# Patient Record
Sex: Male | Born: 1980 | Race: Black or African American | Hispanic: No | Marital: Single | Smoking: Current some day smoker
Health system: Southern US, Community
[De-identification: ages and names within clinical notes are randomized; demographics above are authoritative.]

## PROBLEM LIST (undated history)

## (undated) DIAGNOSIS — H9190 Unspecified hearing loss, unspecified ear: Secondary | ICD-10-CM

## (undated) DIAGNOSIS — I10 Essential (primary) hypertension: Secondary | ICD-10-CM

---

## 2017-04-28 ENCOUNTER — Other Ambulatory Visit: Payer: Self-pay

## 2017-04-28 ENCOUNTER — Encounter (HOSPITAL_COMMUNITY): Payer: Self-pay | Admitting: Emergency Medicine

## 2017-04-28 ENCOUNTER — Emergency Department (HOSPITAL_COMMUNITY): Payer: Self-pay

## 2017-04-28 ENCOUNTER — Emergency Department (HOSPITAL_COMMUNITY)
Admission: EM | Admit: 2017-04-28 | Discharge: 2017-04-28 | Disposition: A | Discharge status: Home / Self Care | Payer: Self-pay | Attending: Emergency Medicine | Admitting: Emergency Medicine

## 2017-04-28 DIAGNOSIS — R0789 Other chest pain: Secondary | ICD-10-CM | POA: Insufficient documentation

## 2017-04-28 DIAGNOSIS — F17200 Nicotine dependence, unspecified, uncomplicated: Secondary | ICD-10-CM | POA: Insufficient documentation

## 2017-04-28 DIAGNOSIS — Z79899 Other long term (current) drug therapy: Secondary | ICD-10-CM | POA: Insufficient documentation

## 2017-04-28 DIAGNOSIS — I10 Essential (primary) hypertension: Secondary | ICD-10-CM | POA: Insufficient documentation

## 2017-04-28 HISTORY — DX: Unspecified hearing loss, unspecified ear: H91.90

## 2017-04-28 HISTORY — DX: Essential (primary) hypertension: I10

## 2017-04-28 LAB — CBC WITH DIFFERENTIAL/PLATELET
Basophils Absolute: 0.1 10*3/uL (ref 0.0–0.1)
Basophils Relative: 1 %
EOS ABS: 0.2 10*3/uL (ref 0.0–0.7)
EOS PCT: 2 %
HCT: 42.9 % (ref 39.0–52.0)
Hemoglobin: 13.5 g/dL (ref 13.0–17.0)
Lymphocytes Relative: 30 %
Lymphs Abs: 3.2 10*3/uL (ref 0.7–4.0)
MCH: 27.3 pg (ref 26.0–34.0)
MCHC: 31.5 g/dL (ref 30.0–36.0)
MCV: 86.7 fL (ref 78.0–100.0)
MONO ABS: 0.6 10*3/uL (ref 0.1–1.0)
MONOS PCT: 5 %
Neutro Abs: 6.7 10*3/uL (ref 1.7–7.7)
Neutrophils Relative %: 62 %
PLATELETS: 394 10*3/uL (ref 150–400)
RBC: 4.95 MIL/uL (ref 4.22–5.81)
RDW: 14.3 % (ref 11.5–15.5)
WBC: 10.6 10*3/uL — AB (ref 4.0–10.5)

## 2017-04-28 LAB — COMPREHENSIVE METABOLIC PANEL
ALT: 21 U/L (ref 17–63)
ANION GAP: 10 (ref 5–15)
AST: 24 U/L (ref 15–41)
Albumin: 4.3 g/dL (ref 3.5–5.0)
Alkaline Phosphatase: 73 U/L (ref 38–126)
BUN: 12 mg/dL (ref 6–20)
CHLORIDE: 101 mmol/L (ref 101–111)
CO2: 29 mmol/L (ref 22–32)
Calcium: 9.2 mg/dL (ref 8.9–10.3)
Creatinine, Ser: 1.03 mg/dL (ref 0.61–1.24)
Glucose, Bld: 98 mg/dL (ref 65–99)
Potassium: 3.4 mmol/L — ABNORMAL LOW (ref 3.5–5.1)
SODIUM: 140 mmol/L (ref 135–145)
Total Bilirubin: 0.3 mg/dL (ref 0.3–1.2)
Total Protein: 8.7 g/dL — ABNORMAL HIGH (ref 6.5–8.1)

## 2017-04-28 LAB — TROPONIN I: Troponin I: <0.03 ng/mL (ref <0.03)

## 2017-04-28 MED ORDER — AMLODIPINE BESYLATE 5 MG PO TABS
ORAL_TABLET | ORAL | Status: AC
  Filled 2017-04-28: qty 1

## 2017-04-28 MED ORDER — IBUPROFEN 400 MG PO TABS
ORAL_TABLET | ORAL | Status: AC
  Administered 2017-04-28: 600 mg
  Filled 2017-04-28: qty 2

## 2017-04-28 MED ORDER — LABETALOL HCL 5 MG/ML IV SOLN
10.0000 mg | Freq: Once | INTRAVENOUS | Status: AC
  Administered 2017-04-28: 10 mg via INTRAVENOUS
  Filled 2017-04-28: qty 4

## 2017-04-28 MED ORDER — AMLODIPINE BESYLATE 5 MG PO TABS
5.0000 mg | ORAL_TABLET | Freq: Once | ORAL | Status: AC
  Administered 2017-04-28: 5 mg via ORAL
  Filled 2017-04-28: qty 1

## 2017-04-28 MED ORDER — HYDROCHLOROTHIAZIDE 25 MG PO TABS
25.0000 mg | ORAL_TABLET | Freq: Every day | ORAL | Status: DC
  Administered 2017-04-28: 25 mg via ORAL
  Filled 2017-04-28 (×2): qty 1

## 2017-04-28 NOTE — ED Provider Notes (Signed)
[facility] EMERGENCY DEPARTMENT Provider Note   CSN: [id number] Arrival date & time: 04/28/17  1608     History   Chief Complaint Chief Complaint  Patient presents with  . Hypertension    HPI Johnny Sweeney is a 37 y.o. male.  HPI Patient with history of hypertension but has not been on blood pressure medication for several years went to the [facility] today for routine physical.  Noted to have elevated blood pressure.  Referred to the emergency department.  Patient denies any headaches or visual changes.  He has minor ongoing lower chest discomfort.  No lower extremity swelling or pain.  Patient infrequently smokes cigarettes. Past Medical History:  Diagnosis Date  . Hearing loss   . Hypertension     There are no active problems to display for this patient.   History reviewed. No pertinent surgical history.     Home Medications    Prior to Admission medications   Medication Sig Start Date End Date Taking? Authorizing Provider  amLODipine (NORVASC) 10 MG tablet Take 10 mg by mouth daily.   Yes [provider]  hydrochlorothiazide (HYDRODIURIL) 25 MG tablet Take 25 mg by mouth daily.   Yes [provider]  Multiple Vitamins-Minerals (MULTIVITAMIN GUMMIES ADULT PO) Take by mouth daily.   Yes [provider]    Family History Family History  Problem Relation Age of Onset  . Diabetes Other   . Hypertension Other     Social History Social History   Tobacco Use  . Smoking status: Current Some Day Smoker  . Smokeless tobacco: Never Used  Substance Use Topics  . Alcohol use: Not on file    Comment: occas  . Drug use: No     Allergies   Patient has no known allergies.   Review of Systems Review of Systems  Constitutional: Negative for chills and fever.  Eyes: Negative for visual disturbance.  Respiratory: Negative for cough and shortness of breath.   Cardiovascular: Positive for chest pain. Negative for palpitations and leg swelling.    Gastrointestinal: Negative for abdominal pain, constipation, diarrhea, nausea and vomiting.  Musculoskeletal: Negative for back pain, neck pain and neck stiffness.  Skin: Negative for rash and wound.  Neurological: Negative for dizziness, weakness, light-headedness, numbness and headaches.  All other systems reviewed and are negative.    Physical Exam Updated Vital Signs BP (!) 196/119 (BP Location: Right Arm)   Pulse 74   Temp 98.1 F (36.7 C) (Oral)   Resp 16   Ht 6\' 2"  (1.88 m)   Wt (!) 172.4 kg (380 lb)   SpO2 100%   BMI 48.79 kg/m   Physical Exam  Constitutional: He is oriented to person, place, and time. He appears well-developed and well-nourished. No distress.  HENT:  Head: Normocephalic and atraumatic.  Mouth/Throat: Oropharynx is clear and moist. No oropharyngeal exudate.  Eyes: EOM are normal. Pupils are equal, round, and reactive to light.  Neck: Normal range of motion. Neck supple. No JVD present. No tracheal deviation present. No thyromegaly present.  Cardiovascular: Normal rate and regular rhythm. Exam reveals no gallop and no friction rub.  No murmur heard. Pulmonary/Chest: Effort normal and breath sounds normal. No stridor. No respiratory distress. He has no wheezes. He has no rales. He exhibits tenderness.  Reproduced mild inferior central chest discomfort with palpation.  No crepitance or deformity.  Abdominal: Soft. Bowel sounds are normal. There is no tenderness. There is no rebound and no guarding.  Musculoskeletal: Normal  range of motion. He exhibits no edema or tenderness.  No lower extremity swelling or asymmetry.  Distal pulses are 3+.  Lymphadenopathy:    He has no cervical adenopathy.  Neurological: He is alert and oriented to person, place, and time.  Moves all extremities without focal deficit.  Sensation intact.  Skin: Skin is warm and dry. Capillary refill takes less than 2 seconds. No rash noted. He is not diaphoretic. No erythema.   Psychiatric: He has a normal mood and affect. His behavior is normal.  Nursing note and vitals reviewed.    ED Treatments / Results  Labs (all labs ordered are listed, but only abnormal results are displayed) Labs Reviewed  CBC WITH DIFFERENTIAL/PLATELET - Abnormal; Notable for the following components:      Result Value   WBC 10.6 (*)    All other components within normal limits  COMPREHENSIVE METABOLIC PANEL - Abnormal; Notable for the following components:   Potassium 3.4 (*)    Total Protein 8.7 (*)    All other components within normal limits  TROPONIN I  TROPONIN I    EKG  EKG Interpretation None       Radiology Dg Chest 2 View  Result Date: 04/28/2017 CLINICAL DATA:  Mid chest pressure, cough with phlegm production a few weeks ago which has resolved, hypertension, social smoker EXAM: CHEST  2 VIEW COMPARISON:  None FINDINGS: Normal heart size, mediastinal contours, and pulmonary vascularity. Lungs clear. No pleural effusion or pneumothorax. Bones unremarkable. IMPRESSION: Normal exam. Electronically Signed   By: Ulyses Southward M.D.   On: 04/28/2017 17:56    Procedures Procedures (including critical care time)  Medications Ordered in ED Medications  hydrochlorothiazide (HYDRODIURIL) tablet 25 mg (25 mg Oral Given 04/28/17 2007)  labetalol (NORMODYNE,TRANDATE) injection 10 mg (10 mg Intravenous Given 04/28/17 1725)  amLODipine (NORVASC) tablet 5 mg (5 mg Oral Given 04/28/17 2006)     Initial Impression / Assessment and Plan / ED Course  I have reviewed the triage vital signs and the nursing notes.  Pertinent labs & imaging results that were available during my care of the patient were reviewed by me and considered in my medical decision making (see chart for details).    Blood pressure is improved.  Patient given a dose of his home meds in the emergency department.  Troponin x2 is normal.  Changes in the EKG likely due to to chronic hypertension.  [facility].  The chest pain is very atypical for CAD, have advised follow-up with cardiology.  Return precautions given.   Final Clinical Impressions(s) / ED Diagnoses   Final diagnoses:  Hypertension, unspecified type  Atypical chest pain    ED Discharge Orders    None       Loren Racer, MD 04/28/17 2105

## 2017-04-28 NOTE — ED Triage Notes (Signed)
[facility] after being checked for hypertension and having an EKG. No copies of EKG sent.

## 2018-10-31 IMAGING — DX DG CHEST 2V
2 series · 2 of 2 positions shown · non-contrast
Comparison: None

CLINICAL DATA: Mid chest pressure, cough with phlegm production a
few weeks ago which has resolved, hypertension, social smoker

EXAM:
CHEST  2 VIEW

[chest pa]
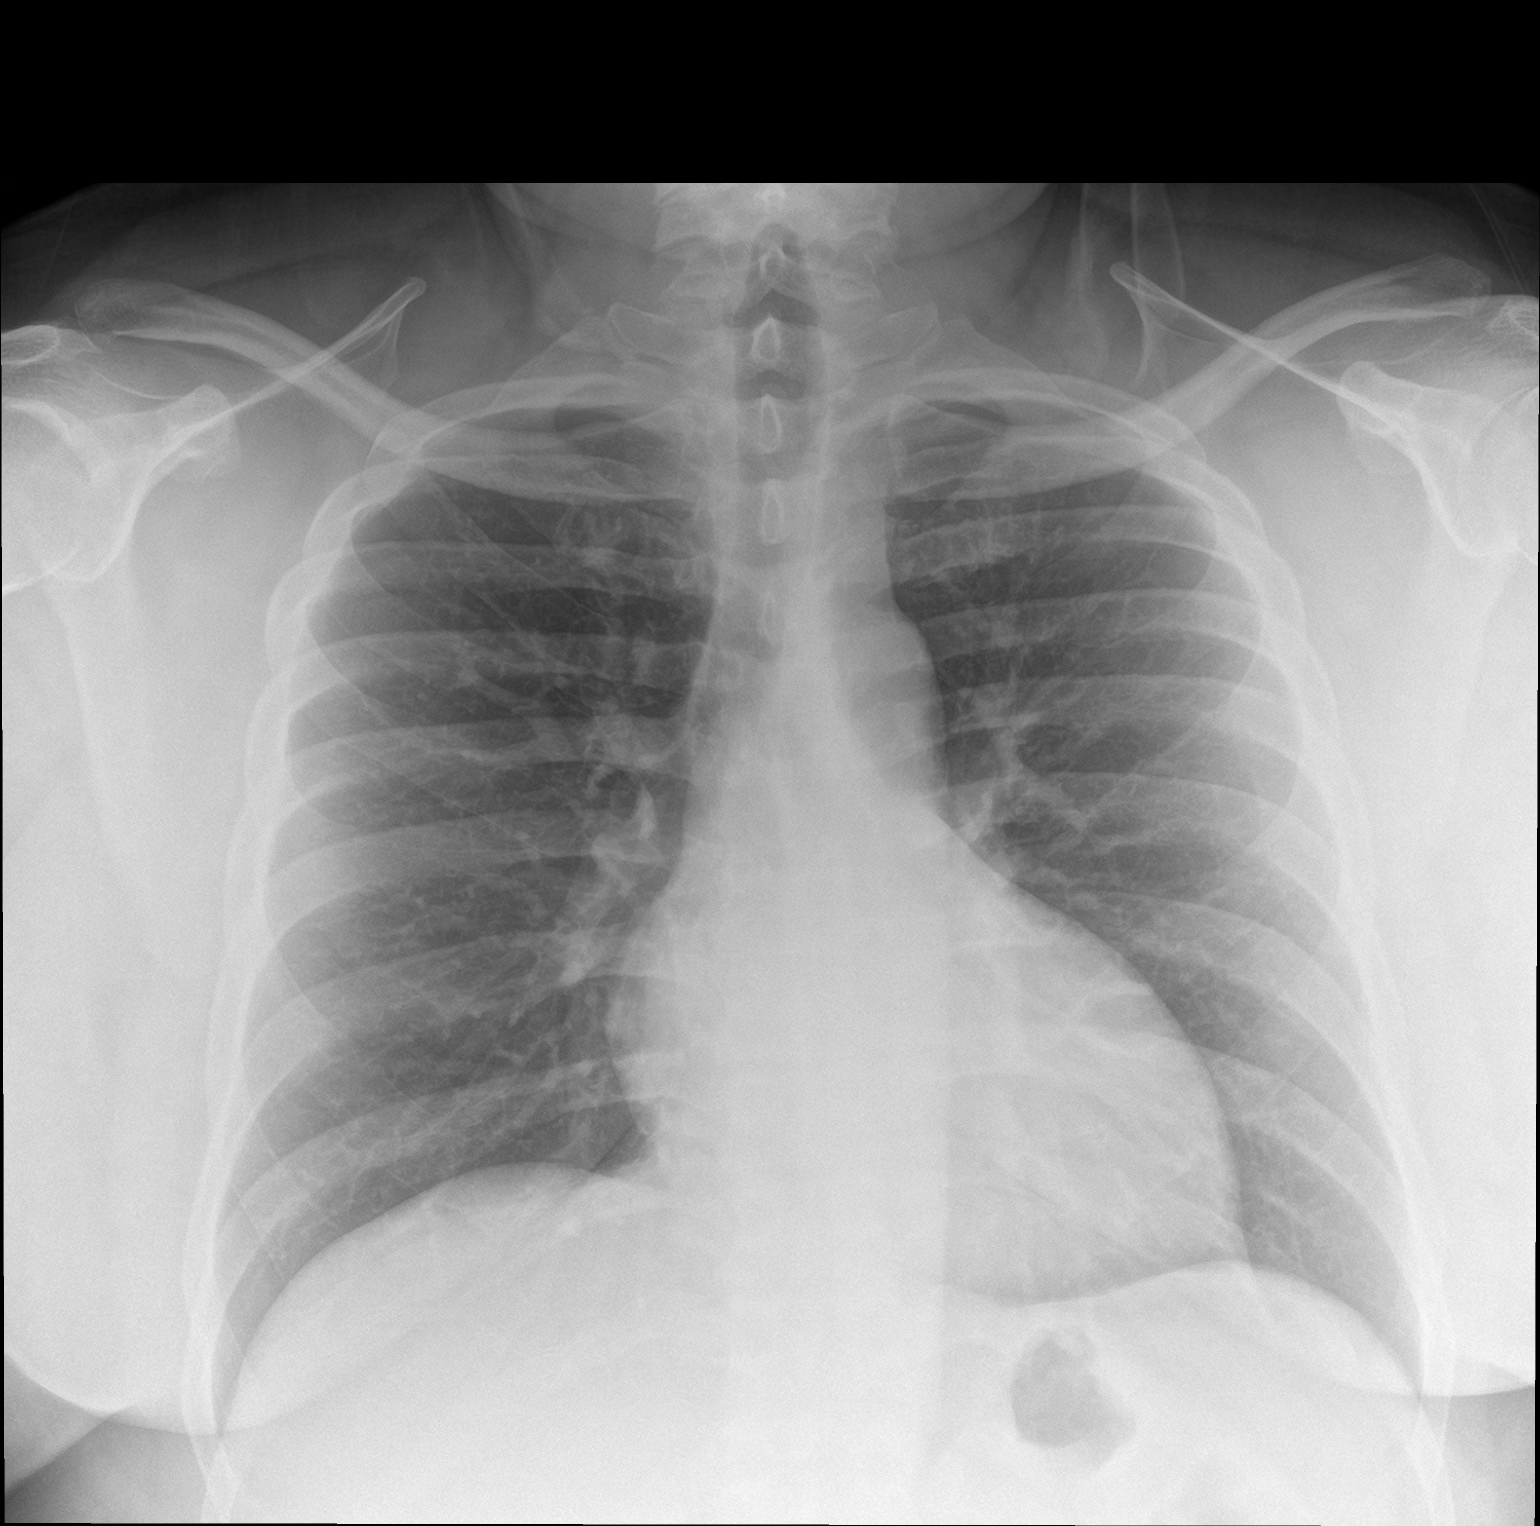

[chest lat]
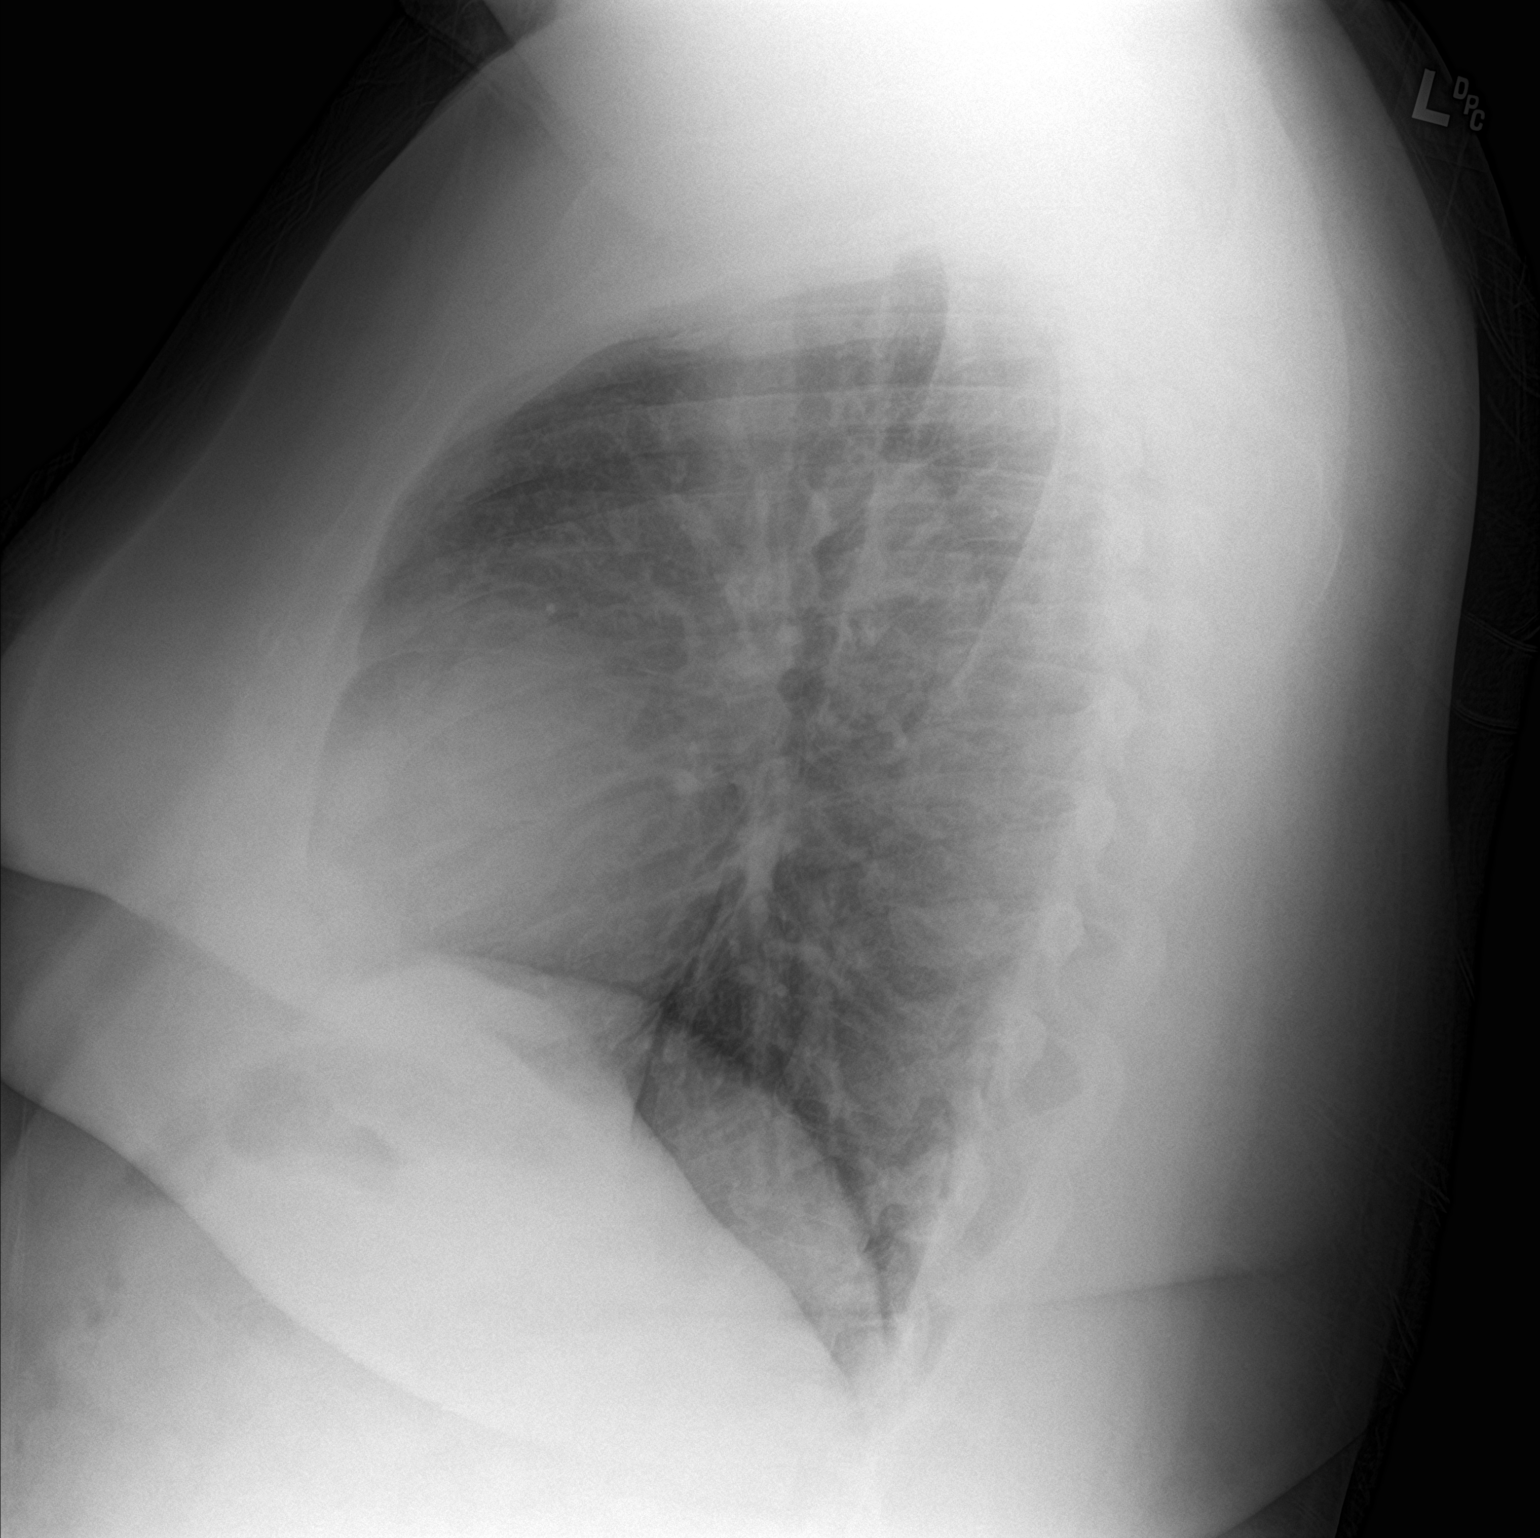

[2 of 2 positions shown; findings below may reference images not displayed]

FINDINGS: Normal heart size, mediastinal contours, and pulmonary vascularity.

Lungs clear.

No pleural effusion or pneumothorax.

Bones unremarkable.
IMPRESSION: Normal exam.
